# Patient Record
Sex: Female | Born: 1990 | Race: Black or African American | Hispanic: No | Marital: Single | Smoking: Never smoker
Health system: Southern US, Community
[De-identification: ages and names within clinical notes are randomized; demographics above are authoritative.]

## PROBLEM LIST (undated history)

## (undated) DIAGNOSIS — R011 Cardiac murmur, unspecified: Secondary | ICD-10-CM

---

## 2012-12-05 ENCOUNTER — Emergency Department: Payer: Self-pay | Admitting: Emergency Medicine

## 2012-12-05 LAB — COMPREHENSIVE METABOLIC PANEL
Albumin: 3.8 g/dL (ref 3.4–5.0)
Anion Gap: 8 (ref 7–16)
Bilirubin,Total: 0.5 mg/dL (ref 0.2–1.0)
Chloride: 103 mmol/L (ref 98–107)
Co2: 23 mmol/L (ref 21–32)
Creatinine: 0.65 mg/dL (ref 0.60–1.30)
EGFR (Non-African Amer.): >60
Glucose: 98 mg/dL (ref 65–99)
Osmolality: 267 (ref 275–301)
Potassium: 3.4 mmol/L — ABNORMAL LOW (ref 3.5–5.1)
SGPT (ALT): 14 U/L (ref 12–78)

## 2012-12-05 LAB — URINALYSIS, COMPLETE
Blood: NEGATIVE
Specific Gravity: 1.026 (ref 1.003–1.030)
WBC UR: 3 /HPF (ref 0–5)

## 2012-12-05 LAB — CBC
HCT: 34.6 % — ABNORMAL LOW (ref 35.0–47.0)
MCH: 32.1 pg (ref 26.0–34.0)
MCHC: 34.2 g/dL (ref 32.0–36.0)
MCV: 94 fL (ref 80–100)
Platelet: 354 10*3/uL (ref 150–440)
RBC: 3.69 10*6/uL — ABNORMAL LOW (ref 3.80–5.20)
RDW: 12.7 % (ref 11.5–14.5)

## 2012-12-05 LAB — HCG, QUANTITATIVE, PREGNANCY: Beta Hcg, Quant.: 37803 m[IU]/mL — ABNORMAL HIGH

## 2014-11-20 IMAGING — US US OB < 14 WEEKS - US OB TV
1 series · 14 of 27 positions shown · non-contrast
Comparison: none

REASON FOR EXAM: lower abd pain
COMMENTS:

[Series 1: us ob < 14 weeks - us ob tv · 0.26mm/px · 14 of 27 slices shown]
[im 1/27]
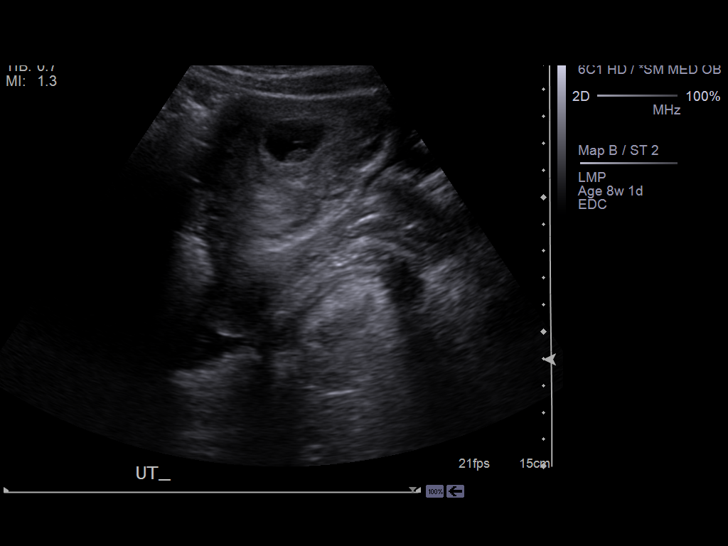
[im 3/27]
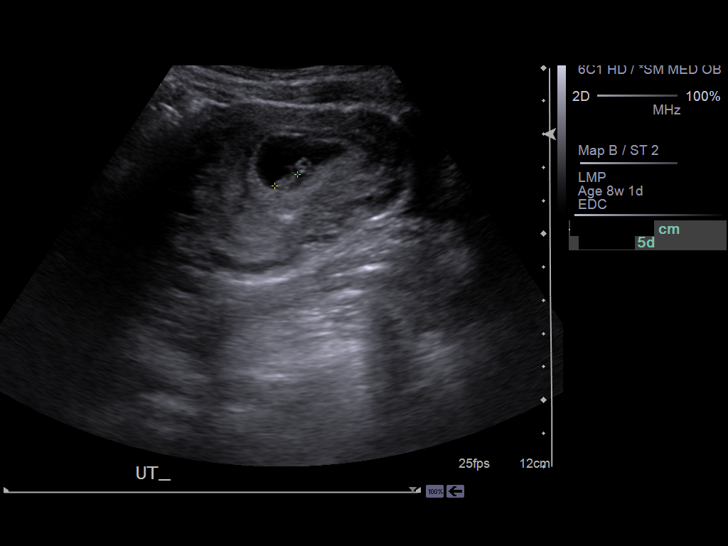
[im 5/27]
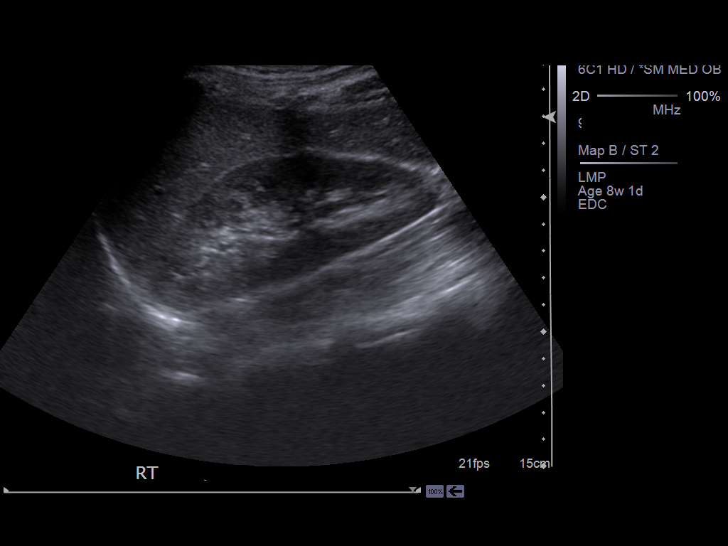
[im 7/27]
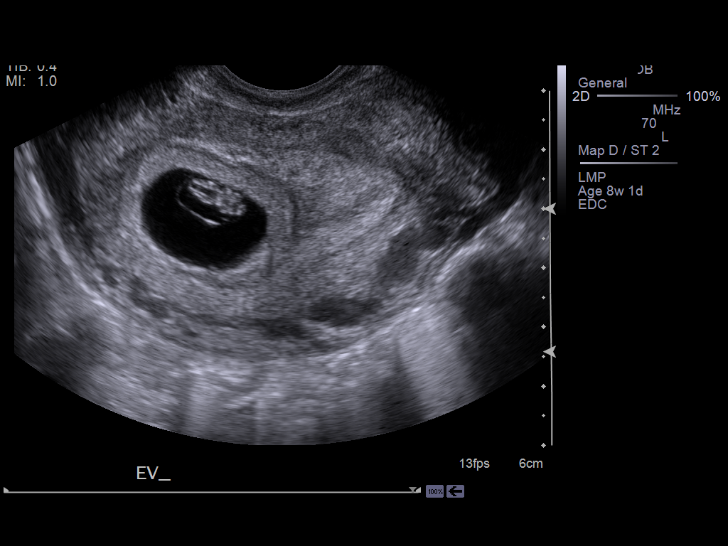
[im 9/27]
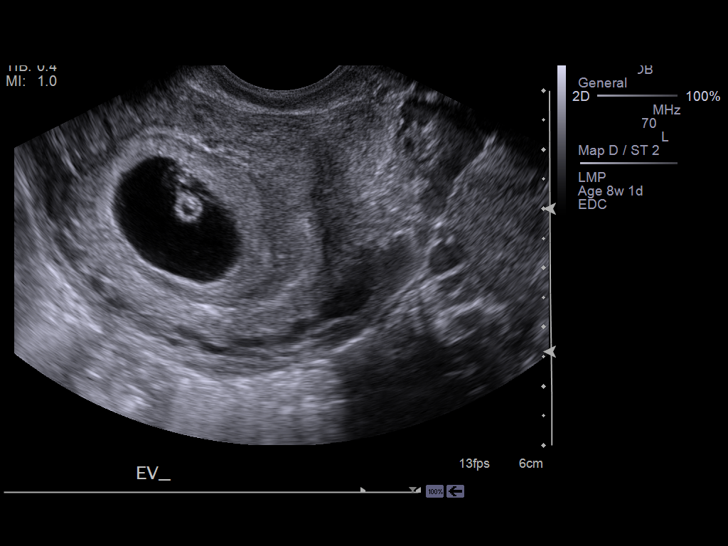
[im 11/27]
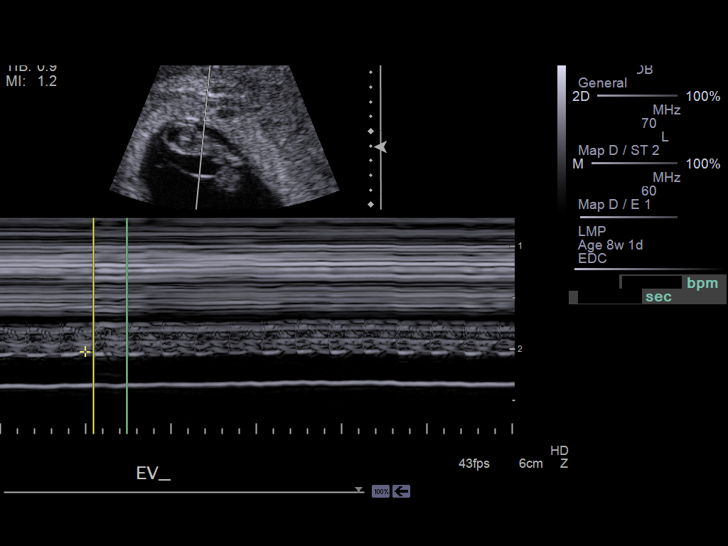
[im 13/27]
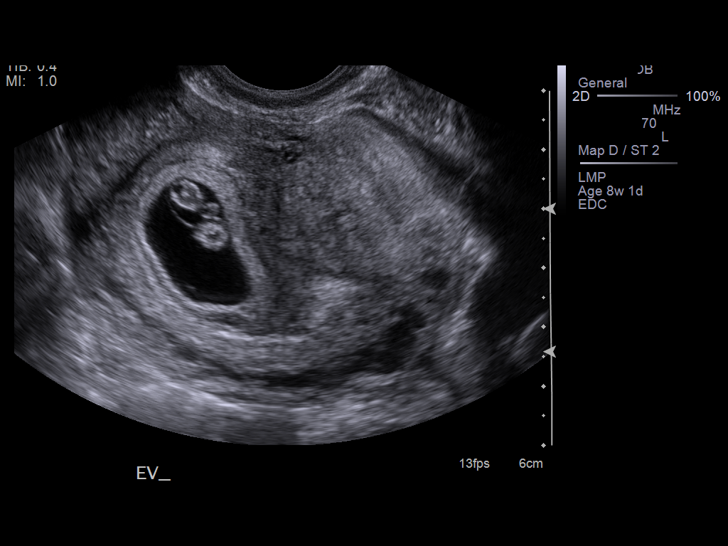
[im 15/27]
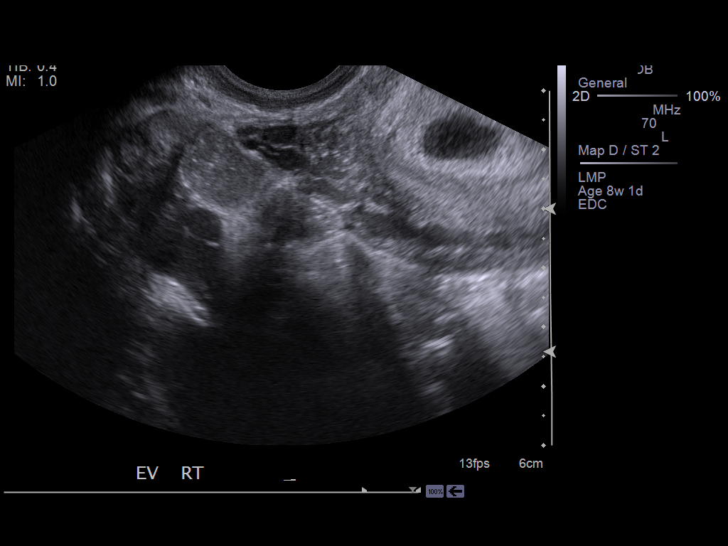
[im 17/27]
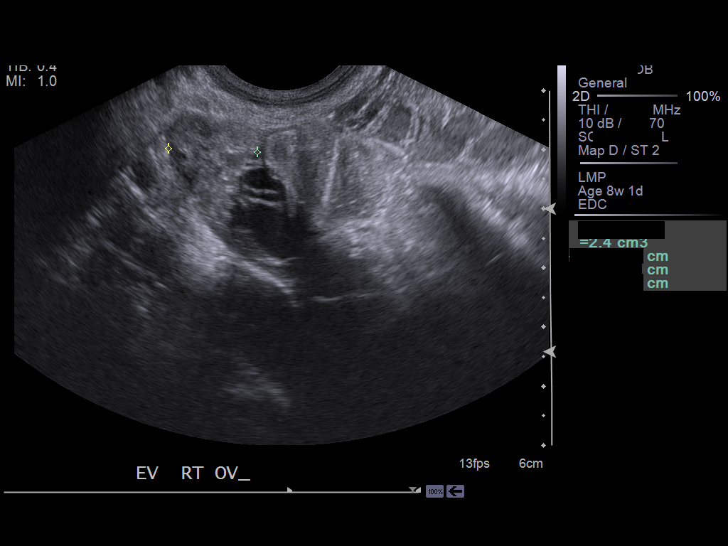
[im 19/27]
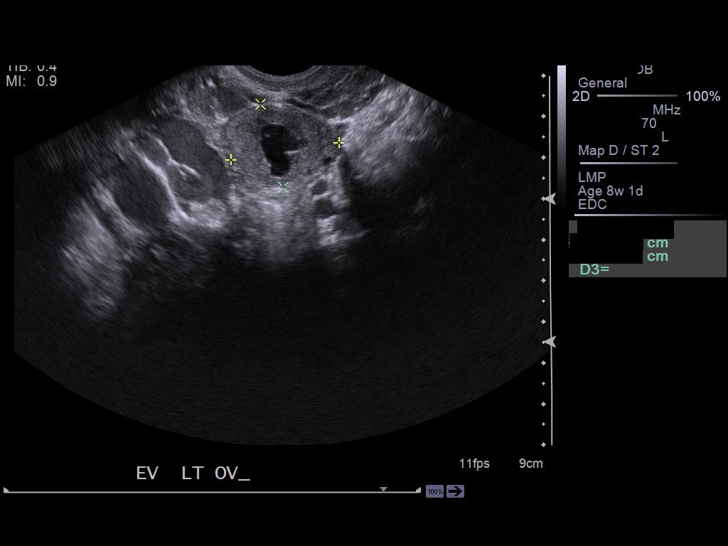
[im 21/27]
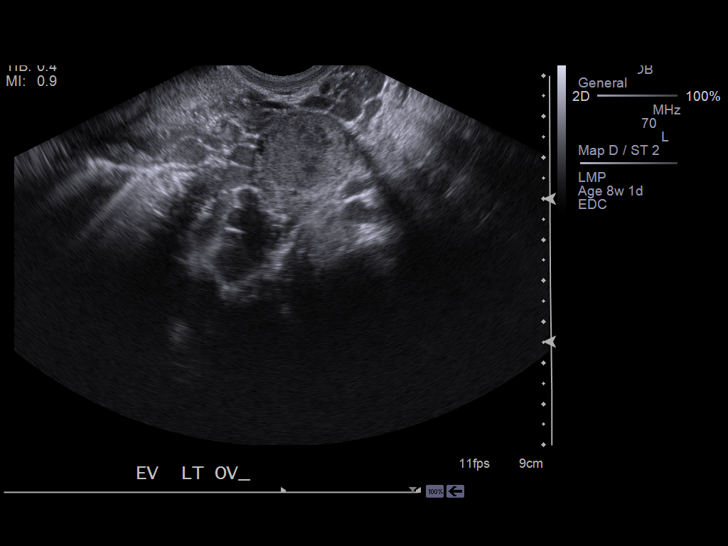
[im 23/27]
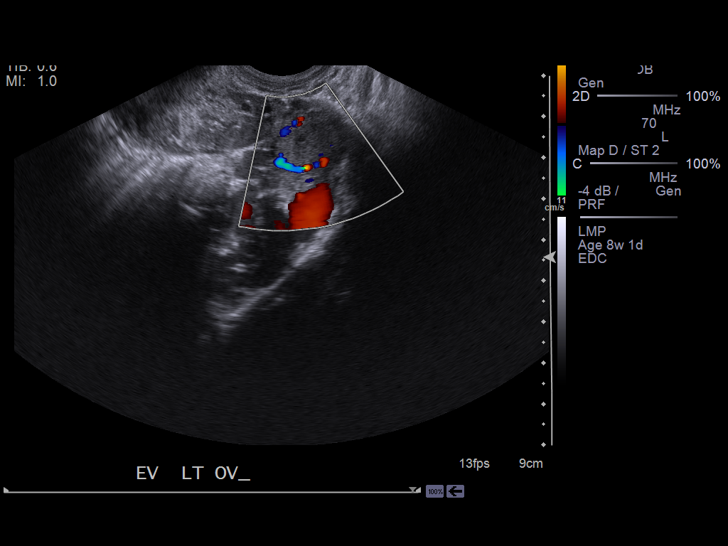
[im 25/27]
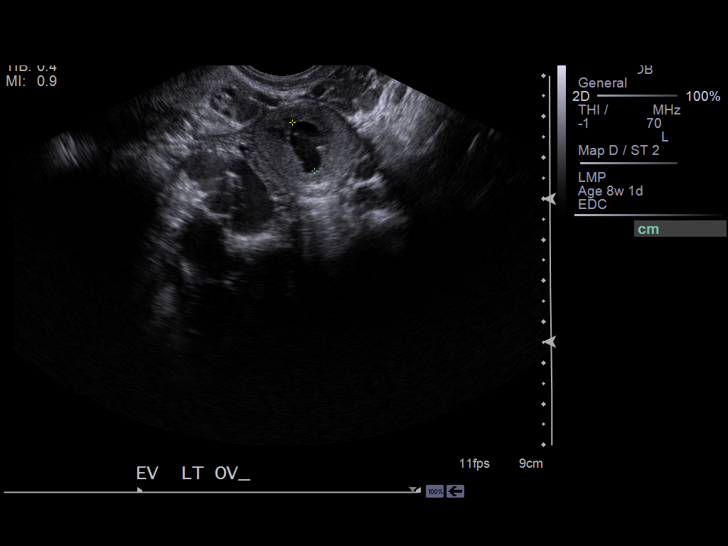
[im 27/27]
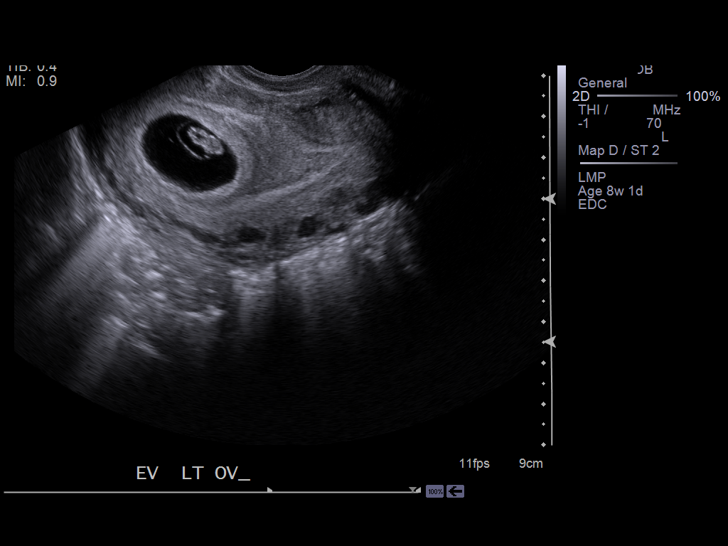

[14 of 27 positions shown; findings below may reference images not displayed]

PROCEDURE:     US  - US OB LESS THAN 14 WEEKS/W TRANS  - December 05, 2012  [DATE]

RESULT:     Emergent transabdominal and endovaginal early OB protocol pelvic
sonogram is performed. Study shows evidence of an intrauterine gestation
with a single fetal pole showing a mean crown-rump length of 0.90 cm
consistent with an ultrasound EDC of 07/25/2013. Fetal heart rate is seen
with a rate of 153 beats per minute demonstrated. The ovaries appear to be
normal with the right measuring 2.52 x 1.21 x 1.50 cm and the left measuring
2.66 x 2.10 x 2.35 cm. There is no abnormal fluid collection. The kidneys
appear to be grossly normal without evidence of obstruction.
IMPRESSION: Early intrauterine gestation as described.

[REDACTED]

## 2015-10-08 ENCOUNTER — Emergency Department
Admission: EM | Admit: 2015-10-08 | Discharge: 2015-10-08 | Disposition: A | Discharge status: Home / Self Care | Payer: Medicaid Other | Attending: Emergency Medicine | Admitting: Emergency Medicine

## 2015-10-08 ENCOUNTER — Encounter: Payer: Self-pay | Admitting: Emergency Medicine

## 2015-10-08 DIAGNOSIS — R1084 Generalized abdominal pain: Secondary | ICD-10-CM | POA: Diagnosis present

## 2015-10-08 DIAGNOSIS — K921 Melena: Secondary | ICD-10-CM | POA: Insufficient documentation

## 2015-10-08 DIAGNOSIS — R195 Other fecal abnormalities: Secondary | ICD-10-CM

## 2015-10-08 HISTORY — DX: Cardiac murmur, unspecified: R01.1

## 2015-10-08 LAB — CBC
HCT: 37.3 % (ref 35.0–47.0)
Hemoglobin: 12.4 g/dL (ref 12.0–16.0)
MCH: 31.2 pg (ref 26.0–34.0)
MCHC: 33.2 g/dL (ref 32.0–36.0)
MCV: 94 fL (ref 80.0–100.0)
PLATELETS: 334 10*3/uL (ref 150–440)
RBC: 3.97 MIL/uL (ref 3.80–5.20)
RDW: 12.8 % (ref 11.5–14.5)
WBC: 6.7 10*3/uL (ref 3.6–11.0)

## 2015-10-08 LAB — URINALYSIS COMPLETE WITH MICROSCOPIC (ARMC ONLY)
BILIRUBIN URINE: NEGATIVE
Bacteria, UA: NONE SEEN
GLUCOSE, UA: NEGATIVE mg/dL
Hgb urine dipstick: NEGATIVE
Leukocytes, UA: NEGATIVE
Nitrite: NEGATIVE
Protein, ur: NEGATIVE mg/dL
Specific Gravity, Urine: 1.02 (ref 1.005–1.030)
pH: 7 (ref 5.0–8.0)

## 2015-10-08 LAB — COMPREHENSIVE METABOLIC PANEL
ALK PHOS: 45 U/L (ref 38–126)
ALT: 13 U/L — AB (ref 14–54)
AST: 17 U/L (ref 15–41)
Albumin: 4.6 g/dL (ref 3.5–5.0)
Anion gap: 4 — ABNORMAL LOW (ref 5–15)
BUN: 10 mg/dL (ref 6–20)
CHLORIDE: 105 mmol/L (ref 101–111)
CO2: 27 mmol/L (ref 22–32)
CREATININE: 0.57 mg/dL (ref 0.44–1.00)
Calcium: 9.5 mg/dL (ref 8.9–10.3)
GFR calc Af Amer: >60 mL/min (ref >60)
Glucose, Bld: 88 mg/dL (ref 65–99)
Potassium: 3.7 mmol/L (ref 3.5–5.1)
Sodium: 136 mmol/L (ref 135–145)
Total Bilirubin: 0.8 mg/dL (ref 0.3–1.2)
Total Protein: 7.9 g/dL (ref 6.5–8.1)

## 2015-10-08 LAB — LIPASE, BLOOD: LIPASE: 21 U/L (ref 11–51)

## 2015-10-08 LAB — POCT PREGNANCY, URINE: Preg Test, Ur: NEGATIVE

## 2015-10-08 MED ORDER — DICYCLOMINE HCL 20 MG PO TABS: 20.0000 mg | ORAL_TABLET | Freq: Three times a day (TID) | ORAL | Status: AC | PRN

## 2015-10-08 NOTE — ED Notes (Addendum)
Pt c/o bright green stool x1 week and abdominal cramping similar to period. Denies bleeding. No c/o n/v/d. Pt also has scattered itching rash she thinks might be mosquito bites.

## 2015-10-08 NOTE — ED Notes (Signed)
States stools have been green in color x 1 week. States has not had diarrhea. Denies fevers.

## 2015-10-08 NOTE — ED Provider Notes (Signed)
[facility] Emergency Department Provider Note  ____________________________________________  Time seen: Approximately 3:33 PM  I have reviewed the triage vital signs and the nursing notes.   HISTORY  Chief Complaint Stool Color Change and Abdominal Pain   HPI Morgan Raymond is a 25 y.o. female who presents to the emergency department for evaluation of green stool x 7 days. Stool is formed, but looser than usual. She states that it is bright green in color and "some of the color comes off of it in the toilet." She denies new foods, taking vitamins or new medications. She denies fever, nausea, vomiting or recent illness. She denies weight loss. She denies feeling bloated or gassy. She began having some cramping about 2 days ago that is described as similar to a menstrual cycle but worse.    Past Medical History  Diagnosis Date  . Heart murmur     There are no active problems to display for this patient.   History reviewed. No pertinent past surgical history.  Current Outpatient Rx  Name  Route  Sig  Dispense  Refill  . dicyclomine (BENTYL) 20 MG tablet   Oral   Take 1 tablet (20 mg total) by mouth 3 (three) times daily as needed for spasms.   30 tablet   0     Allergies Penicillins  No family history on file.  Social History Social History  Substance Use Topics  . Smoking status: Never Smoker   . Smokeless tobacco: None  . Alcohol Use: No    Review of Systems Constitutional: No fever/chills Eyes: No visual changes. ENT: No sore throat. Gastrointestinal: Positive for abdominal pain.  No nausea, no vomiting.  No diarrhea.  No constipation. Genitourinary: Negative for dysuria. Musculoskeletal: Negative for back pain. Skin: Negative for rash. Neurological: Negative for headaches, focal weakness or numbness. ____________________________________________   PHYSICAL EXAM:  VITAL SIGNS: ED Triage Vitals  Enc Vitals Group     BP  10/08/15 1308 110/80 mmHg     Pulse Rate 10/08/15 1308 82     Resp 10/08/15 1308 18     Temp 10/08/15 1308 98.2 F (36.8 C)     Temp Source 10/08/15 1308 Oral     SpO2 10/08/15 1308 99 %     Weight 10/08/15 1308 120 lb (54.432 kg)     Height 10/08/15 1308  (1.626 m)     Head Cir --      Peak Flow --      Pain Score 10/08/15 1310 5     Pain Loc --      Pain Edu? --      Excl. in GC? --     Constitutional: Alert and oriented. Well appearing and in no acute distress. Eyes: Conjunctivae are normal. PERRL. EOMI. Head: Atraumatic. Nose: No congestion/rhinnorhea. Mouth/Throat: Mucous membranes are moist.  Oropharynx non-erythematous. Neck: No stridor.   Cardiovascular: Normal rate, regular rhythm. Grossly normal heart sounds.  Good peripheral circulation. Respiratory: Normal respiratory effort.  No retractions. Lungs CTAB. Gastrointestinal: Soft, no rebound or guarding. Tenderness to the left lower quadrant on palpation. No distention. No abdominal bruits.  Rectal: No external hemorrhoids noted. No fissures noted. No gross blood observed on exam. No pain with exam. Musculoskeletal: No lower extremity tenderness nor edema.  No joint effusions. Neurologic:  Normal speech and language. No gross focal neurologic deficits are appreciated. No gait instability. Skin:  Skin is warm, dry and intact. No rash noted. Psychiatric: Mood and  affect are normal. Speech and behavior are normal.  ____________________________________________   LABS (all labs ordered are listed, but only abnormal results are displayed)  Labs Reviewed  COMPREHENSIVE METABOLIC PANEL - Abnormal; Notable for the following:    ALT 13 (*)    Anion gap 4 (*)    All other components within normal limits  URINALYSIS COMPLETEWITH MICROSCOPIC ([facility] ONLY) - Abnormal; Notable for the following:    Color, Urine YELLOW (*)    APPearance HAZY (*)    Ketones, ur 1+ (*)    Squamous Epithelial / LPF 6-30 (*)    All other  components within normal limits  LIPASE, BLOOD  CBC  POC URINE PREG, ED  POCT PREGNANCY, URINE    Hemoccult Positive. ____________________________________________  EKG   ____________________________________________  RADIOLOGY  Not indicated. ____________________________________________   PROCEDURES  Procedure(s) performed: None  Critical Care performed: No  ____________________________________________   INITIAL IMPRESSION / ASSESSMENT AND PLAN / ED COURSE  Pertinent labs & imaging results that were available during my care of the patient were reviewed by me and considered in my medical decision making (see chart for details).  Prescription for bentyl given. Patient was unable to provide specimen for culture and O&P. She was given a sample cup and advised to schedule an appointment with GI. She was advised to fill the cup just prior to that appointment if able and take it with her to the appointment. She was advised to return to the ER for an increase in abdominal pain or if the bleeding gets worse.  ____________________________________________   FINAL CLINICAL IMPRESSION(S) / ED DIAGNOSES  Final diagnoses:  Stool color abnormal  Hematochezia      NEW MEDICATIONS STARTED DURING THIS VISIT:  Discharge Medication List as of 10/08/2015  4:31 PM    START taking these medications   Details  dicyclomine (BENTYL) 20 MG tablet Take 1 tablet (20 mg total) by mouth 3 (three) times daily as needed for spasms., Starting 10/08/2015, Until Sun 10/07/16, Print         Note:  This document was prepared using Dragon voice recognition software and may include unintentional dictation errors.    Chinita PesterCari B Ytzel Gubler, FNP 10/08/15 2317  Sharyn CreamerMark Quale, MD 10/15/15 2116

## 2015-10-08 NOTE — Discharge Instructions (Signed)
Avoid taking BC Powder, Advil, Ibuprofen or any other NSAIDs.

## 2015-10-08 NOTE — ED Notes (Signed)
Discussed discharge instructions, prescriptions, and follow-up care with patient. No questions or concerns at this time. Pt stable at discharge.
